# Patient Record
Sex: Female | Born: 1960 | Race: Black or African American | Hispanic: No | Marital: Married | Smoking: Never smoker
Health system: Southern US, Community
[De-identification: ages and names within clinical notes are randomized; demographics above are authoritative.]

## PROBLEM LIST (undated history)

## (undated) DIAGNOSIS — I10 Essential (primary) hypertension: Secondary | ICD-10-CM

## (undated) DIAGNOSIS — E039 Hypothyroidism, unspecified: Secondary | ICD-10-CM

---

## 2020-03-17 ENCOUNTER — Other Ambulatory Visit: Payer: Self-pay

## 2020-03-17 DIAGNOSIS — Z20822 Contact with and (suspected) exposure to covid-19: Secondary | ICD-10-CM

## 2020-03-18 LAB — NOVEL CORONAVIRUS, NAA: SARS-CoV-2, NAA: NOT DETECTED

## 2020-03-18 LAB — SARS-COV-2, NAA 2 DAY TAT

## 2021-05-06 ENCOUNTER — Other Ambulatory Visit: Payer: Self-pay

## 2021-05-06 ENCOUNTER — Ambulatory Visit (INDEPENDENT_AMBULATORY_CARE_PROVIDER_SITE_OTHER): Payer: BC Managed Care – PPO | Admitting: Orthopaedic Surgery

## 2021-05-06 ENCOUNTER — Ambulatory Visit (INDEPENDENT_AMBULATORY_CARE_PROVIDER_SITE_OTHER): Payer: BC Managed Care – PPO

## 2021-05-06 DIAGNOSIS — M25512 Pain in left shoulder: Secondary | ICD-10-CM | POA: Diagnosis not present

## 2021-05-06 DIAGNOSIS — G8929 Other chronic pain: Secondary | ICD-10-CM

## 2021-05-06 NOTE — Progress Notes (Signed)
? ?Office Visit Note ?  ?Patient: Amanda Velasquez           ?Date of Birth: [birth year]           ?MRN: [id number] ?Visit Date: 05/06/2021 ?             ?Requested by: No referring provider defined for this encounter. ?PCP: No primary care provider on file. ? ? ?Assessment & Plan: ?Visit Diagnoses:  ?1. Chronic left shoulder pain   ? ? ?Plan: Impression is chronic left upper arm pain and popping of unclear etiology.  This is a work-related injury.  She has been to her PCP about this and this has not gotten better so we will order an MRI of the left humerus to rule out structural normalities.  Follow-up after the MRI. ? ?Follow-Up Instructions: No follow-ups on file.  ? ?Orders:  ?Orders Placed This Encounter  ?Procedures  ? XR Shoulder Left  ? ?No orders of the defined types were placed in this encounter. ? ? ? ? Procedures: ?No procedures performed ? ? ?Clinical Data: ?No additional findings. ? ? ?Subjective: ?Chief Complaint  ?Patient presents with  ? Left Shoulder - Pain  ? ? ?HPI ? 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  She states that she had immediate pain in her arm has felt painful popping and pulling sensation towards the anterior aspect of the upper arm.  She has trouble reaching backwards to unhook her bra.  Denies any radicular symptoms.  Motrin gives temporary relief. ? ?Review of Systems  ?Constitutional: Negative.   ?HENT: Negative.    ?Eyes: Negative.   ?Respiratory: Negative.    ?Cardiovascular: Negative.   ?Endocrine: Negative.   ?Musculoskeletal: Negative.   ?Neurological: Negative.   ?Hematological: Negative.   ?Psychiatric/Behavioral: Negative.    ?All other systems reviewed and are negative. ? ? ?Objective: ?Vital Signs: There were no vitals taken for this visit. ? ?Physical Exam ?Vitals and nursing  note reviewed.  ?Constitutional:   ?   Appearance: She is well-developed.  ?HENT:  ?   Head: Normocephalic and atraumatic.  ?Pulmonary:  ?   Effort: Pulmonary effort is normal.  ?Abdominal:  ?   Palpations: Abdomen is soft.  ?Musculoskeletal:  ?   Cervical back: Neck supple.  ?Skin: ?   General: Skin is warm.  ?   Capillary Refill: Capillary refill takes less than 2 seconds.  ?Neurological:  ?   Mental Status: She is alert and oriented to person, place, and time.  ?Psychiatric:     ?   Behavior: Behavior normal.     ?   Thought Content: Thought content normal.     ?   Judgment: Judgment normal.  ? ? ?Ortho Exam ? ?Examination of the left shoulder and upper arm shows normal forward flexion external rotation abduction.  She has limited internal rotation to the side of the hip secondary to pain and guarding.  Manual muscle testing is normal.  She has no Popeye deformity or bruising or asymmetry.  Distal biceps tendon is intact. ? ?Specialty Comments:  ?No specialty comments available. ? ?Imaging: ?XR Shoulder Left ? ?Result Date: 05/06/2021 ?Type II acromion.  No acute or structural abnormalities.  ? ? ?PMFS History: ?There are no problems to display for  this patient. ? ?No past medical history on file.  ?No family history on file.  ? ?Social History  ? ?Occupational History  ? Not on file  ?Tobacco Use  ? Smoking status: Not on file  ? Smokeless tobacco: Not on file  ?Substance and Sexual Activity  ? Alcohol use: Not on file  ? Drug use: Not on file  ? Sexual activity: Not on file  ? ? ? ? ? ? ?

## 2021-05-16 ENCOUNTER — Other Ambulatory Visit: Payer: Self-pay

## 2021-05-16 ENCOUNTER — Ambulatory Visit
Admission: RE | Admit: 2021-05-16 | Discharge: 2021-05-16 | Disposition: A | Discharge status: Home / Self Care | Payer: Self-pay | Source: Ambulatory Visit | Attending: Orthopaedic Surgery | Admitting: Orthopaedic Surgery

## 2021-05-16 DIAGNOSIS — G8929 Other chronic pain: Secondary | ICD-10-CM

## 2021-05-27 ENCOUNTER — Other Ambulatory Visit: Payer: Self-pay

## 2021-05-27 ENCOUNTER — Ambulatory Visit (INDEPENDENT_AMBULATORY_CARE_PROVIDER_SITE_OTHER): Payer: BC Managed Care – PPO | Admitting: Orthopaedic Surgery

## 2021-05-27 DIAGNOSIS — G8929 Other chronic pain: Secondary | ICD-10-CM | POA: Diagnosis not present

## 2021-05-27 DIAGNOSIS — M25512 Pain in left shoulder: Secondary | ICD-10-CM

## 2021-05-27 NOTE — Progress Notes (Signed)
? ?  Office Visit Note ?  ?Patient: Amanda Velasquez           ?Date of Birth: 03/22/1960           ?MRN: [id number] ?Visit Date: 05/27/2021 ?             ?Requested by: No referring provider defined for this encounter. ?PCP: No primary care provider on file. ? ? ?Assessment & Plan: ?Visit Diagnoses:  ?1. Chronic left shoulder pain   ? ? ?Plan: Amanda Velasquez returns today to discuss left humerus MRI.  Denies any changes in symptoms. ? ?Examination of the left shoulder shows mild pain with manual muscle testing of the superior cuff.  There is no weakness.  Exam is unchanged. ? ?MRI of the left humerus is unremarkable.  There is suspected tendinopathy of the superior cuff with degenerative subcortical cyst in the greater tuberosity.  Due to the nature of the MRI at the resolution is suboptimal therefore we will need a dedicated shoulder MRI to rule out structural abnormalities.  Based on findings her symptoms are consistent with shoulder pathology.  Follow-up after the shoulder MRI. ? ?Follow-Up Instructions: No follow-ups on file.  ? ?Orders:  ?No orders of the defined types were placed in this encounter. ? ?No orders of the defined types were placed in this encounter. ? ? ? ? Procedures: ?No procedures performed ? ? ?Clinical Data: ?No additional findings. ? ? ?Subjective: ?Chief Complaint  ?Patient presents with  ? Left Upper Arm - Follow-up  ?  MRI review  ? ? ?HPI ? ?Review of Systems ? ? ?Objective: ?Vital Signs: There were no vitals taken for this visit. ? ?Physical Exam ? ?Ortho Exam ? ?Specialty Comments:  ?No specialty comments available. ? ?Imaging: ?No results found. ? ? ?PMFS History: ?Patient Active Problem List  ? Diagnosis Date Noted  ? Chronic left shoulder pain 05/27/2021  ? ?No past medical history on file.  ?No family history on file.  ? ?Social History  ? ?Occupational History  ? Not on file  ?Tobacco Use  ? Smoking status: Not on file  ? Smokeless tobacco: Not on file  ?Substance and Sexual Activity  ? Alcohol  use: Not on file  ? Drug use: Not on file  ? Sexual activity: Not on file  ? ? ? ? ? ? ?

## 2021-06-14 ENCOUNTER — Ambulatory Visit
Admission: RE | Admit: 2021-06-14 | Discharge: 2021-06-14 | Disposition: A | Discharge status: Home / Self Care | Payer: No Typology Code available for payment source | Source: Ambulatory Visit | Attending: Orthopaedic Surgery | Admitting: Orthopaedic Surgery

## 2021-06-14 DIAGNOSIS — G8929 Other chronic pain: Secondary | ICD-10-CM

## 2021-06-14 MED ORDER — IOPAMIDOL (ISOVUE-M 200) INJECTION 41%
15.0000 mL | Freq: Once | INTRAMUSCULAR | Status: AC
  Administered 2021-06-14: 15 mL via INTRA_ARTICULAR

## 2021-06-14 MED ORDER — IOPAMIDOL (ISOVUE-M 200) INJECTION 41%: 15.0000 mL | Freq: Once | INTRAMUSCULAR | Status: DC

## 2021-06-15 NOTE — Progress Notes (Signed)
Needs appt

## 2021-06-16 NOTE — Progress Notes (Signed)
APPT MADE

## 2021-06-23 ENCOUNTER — Other Ambulatory Visit: Payer: Self-pay

## 2021-06-23 ENCOUNTER — Ambulatory Visit (INDEPENDENT_AMBULATORY_CARE_PROVIDER_SITE_OTHER): Payer: BC Managed Care – PPO | Admitting: Orthopaedic Surgery

## 2021-06-23 ENCOUNTER — Encounter: Payer: Self-pay | Admitting: Orthopaedic Surgery

## 2021-06-23 ENCOUNTER — Telehealth: Payer: Self-pay | Admitting: Orthopaedic Surgery

## 2021-06-23 DIAGNOSIS — G8929 Other chronic pain: Secondary | ICD-10-CM

## 2021-06-23 DIAGNOSIS — M25512 Pain in left shoulder: Secondary | ICD-10-CM

## 2021-06-23 NOTE — Telephone Encounter (Signed)
PT order made

## 2021-06-23 NOTE — Progress Notes (Signed)
? ?  Office Visit Note ?  ?Patient: Amanda Velasquez           ?Date of Birth: 1960-10-29           ?MRN: [id number] ?Visit Date: 06/23/2021 ?             ?Requested by: No referring provider defined for this encounter. ?PCP: Pcp, No ? ? ?Assessment & Plan: ?Visit Diagnoses:  ?1. Chronic left shoulder pain   ? ? ?Plan: Velicia returns today to discuss left shoulder MRI.  Denies any changes in symptoms. ? ?Examination of the left shoulder is unchanged. ? ?MRI of the left shoulder basically shows tendinopathy of the superior cuff.  Type II acromion and moderate AC joint changes.  No full-thickness defects.  These findings were reviewed with Gunnar Fusi in detail and treatment options were reviewed including physical therapy, cortisone injection, NSAIDs.  She will just stick to NSAIDs for now and she will follow-up if her symptoms get worse.  Discussed that the findings do not appear to be traumatic but rather degenerative. ? ?Follow-Up Instructions: No follow-ups on file.  ? ?Orders:  ?No orders of the defined types were placed in this encounter. ? ?No orders of the defined types were placed in this encounter. ? ? ? ? Procedures: ?No procedures performed ? ? ?Clinical Data: ?No additional findings. ? ? ?Subjective: ?Chief Complaint  ?Patient presents with  ? Left Shoulder - Pain  ? ? ?HPI ? ?Review of Systems  ?Constitutional: Negative.   ?HENT: Negative.    ?Eyes: Negative.   ?Respiratory: Negative.    ?Cardiovascular: Negative.   ?Endocrine: Negative.   ?Musculoskeletal: Negative.   ?Neurological: Negative.   ?Hematological: Negative.   ?Psychiatric/Behavioral: Negative.    ?All other systems reviewed and are negative. ? ? ?Objective: ?Vital Signs: There were no vitals taken for this visit. ? ?Physical Exam ?Vitals and nursing note reviewed.  ?Constitutional:   ?   Appearance: She is well-developed.  ?Pulmonary:  ?   Effort: Pulmonary effort is normal.  ?Skin: ?   General: Skin is warm.  ?   Capillary Refill: Capillary refill  takes less than 2 seconds.  ?Neurological:  ?   Mental Status: She is alert and oriented to person, place, and time.  ?Psychiatric:     ?   Behavior: Behavior normal.     ?   Thought Content: Thought content normal.     ?   Judgment: Judgment normal.  ? ? ?Ortho Exam ? ?Specialty Comments:  ?No specialty comments available. ? ?Imaging: ?No results found. ? ? ?PMFS History: ?Patient Active Problem List  ? Diagnosis Date Noted  ? Chronic left shoulder pain 05/27/2021  ? ?History reviewed. No pertinent past medical history.  ?History reviewed. No pertinent family history.  ?History reviewed. No pertinent surgical history. ?Social History  ? ?Occupational History  ? Not on file  ?Tobacco Use  ? Smoking status: Not on file  ? Smokeless tobacco: Not on file  ?Substance and Sexual Activity  ? Alcohol use: Not on file  ? Drug use: Not on file  ? Sexual activity: Not on file  ? ? ? ? ? ? ?

## 2021-06-23 NOTE — Telephone Encounter (Signed)
Patient called. She would like to have physical therapy. Would like a referral for PT. Her call back number is [phone] ?

## 2022-10-11 ENCOUNTER — Ambulatory Visit
Admission: EM | Admit: 2022-10-11 | Discharge: 2022-10-11 | Disposition: A | Discharge status: Home / Self Care | Payer: BC Managed Care – PPO

## 2022-10-11 DIAGNOSIS — Z5189 Encounter for other specified aftercare: Secondary | ICD-10-CM

## 2022-10-11 HISTORY — DX: Hypothyroidism, unspecified: E03.9

## 2022-10-11 HISTORY — DX: Essential (primary) hypertension: I10

## 2022-10-11 NOTE — ED Provider Notes (Signed)
[facility]    CSN: [id number] Arrival date & time: 10/11/22  1655      History   Chief Complaint Chief Complaint  Patient presents with   Wound Check    HPI Amanda Velasquez is a 62 y.o. female.   62 year old female, Amanda Velasquez, presents to urgent care for evaluation of wound from recent dog bite. Pt states she was bitten by Pit bull approximately 2 weeks prior at work social event in [place], pt states she was evaluated and placed on augmentin and continued to have swelling and drainage and was switched to clindamycin. Pt states it is still sore, but healing. Pt's tetanus is up to date.   PMH of HTN, Pt is right hand dominant.  The history is provided by the patient. No language interpreter was used.    Past Medical History:  Diagnosis Date   Hypertension    Hypothyroid     Patient Active Problem List   Diagnosis Date Noted   Visit for wound check 10/11/2022   Chronic left shoulder pain 05/27/2021    Past Surgical History:  Procedure Laterality Date   CESAREAN SECTION      OB History   No obstetric history on file.      Home Medications    Prior to Admission medications   Medication Sig Start Date End Date Taking? Authorizing Provider  amLODipine (NORVASC) 10 MG tablet Take 10 mg by mouth daily.    [provider]  hydrochlorothiazide (HYDRODIURIL) 25 MG tablet Take 25 mg by mouth daily.    [provider]  rosuvastatin (CRESTOR) 10 MG tablet Take 10 mg by mouth at bedtime.    [provider]    Family History History reviewed. No pertinent family history.  Social History Social History   Tobacco Use   Smoking status: Never   Smokeless tobacco: Never     Allergies   Minocin [minocycline]   Review of Systems Review of Systems  Musculoskeletal:  Positive for myalgias.  Skin:  Positive for wound.  All other systems reviewed and are negative.    Physical Exam Triage Vital Signs ED Triage Vitals  [10/11/22 1727]  Encounter Vitals Group     BP (!) 156/86     Systolic BP Percentile      Diastolic BP Percentile      Pulse Rate 85     Resp 18     Temp 98.2 F (36.8 C)     Temp Source Oral     SpO2 100 %     Weight      Height      Head Circumference      Peak Flow      Pain Score 2     Pain Loc      Pain Education      Exclude from Growth Chart    No data found.  Updated Vital Signs BP (!) 156/86 (BP Location: Left Arm)   Pulse 85   Temp 98.2 F (36.8 C) (Oral)   Resp 18   SpO2 100%   Visual Acuity Right Eye Distance:   Left Eye Distance:   Bilateral Distance:    Right Eye Near:   Left Eye Near:    Bilateral Near:     Physical Exam Vitals and nursing note reviewed.  Skin:    Findings: Signs of injury and wound present.     Comments: Right upper arm, healing dog bite, see photo  Neurological:  General: No focal deficit present.     Mental Status: She is alert and oriented to person, place, and time.     GCS: GCS eye subscore is 4. GCS verbal subscore is 5. GCS motor subscore is 6.     Cranial Nerves: No cranial nerve deficit.     Sensory: No sensory deficit.  Psychiatric:        Attention and Perception: Attention normal.        Mood and Affect: Mood normal.        Speech: Speech normal.        Behavior: Behavior normal.      UC Treatments / Results  Labs (all labs ordered are listed, but only abnormal results are displayed) Labs Reviewed - No data to display  EKG   Radiology No results found.  Procedures Procedures (including critical care time)  Medications Ordered in UC Medications - No data to display  Initial Impression / Assessment and Plan / UC Course  I have reviewed the triage vital signs and the nursing notes.  Pertinent labs & imaging results that were available during my care of the patient were reviewed by me and considered in my medical decision making (see chart for details).     Ddx: Dog bite, wound check Final  Clinical Impressions(s) / UC Diagnoses   Final diagnoses:  Visit for wound check     Discharge Instructions      Wound appears to be healing well,continue wound care as previously recommended. Follow up with PCP as needed.      ED Prescriptions   None    PDMP not reviewed this encounter.   Clancy Gourd, NP 10/11/22 [phone]

## 2022-10-11 NOTE — Discharge Instructions (Addendum)
Wound appears to be healing well,continue wound care as previously recommended. Follow up with PCP as needed.

## 2022-10-11 NOTE — ED Triage Notes (Signed)
Wound check from dog bite

## 2022-12-12 ENCOUNTER — Other Ambulatory Visit: Payer: Self-pay

## 2022-12-12 ENCOUNTER — Emergency Department: Payer: BC Managed Care – PPO

## 2022-12-12 DIAGNOSIS — R09A2 Foreign body sensation, throat: Secondary | ICD-10-CM | POA: Diagnosis not present

## 2022-12-12 DIAGNOSIS — Z5321 Procedure and treatment not carried out due to patient leaving prior to being seen by health care provider: Secondary | ICD-10-CM | POA: Insufficient documentation

## 2022-12-12 DIAGNOSIS — R070 Pain in throat: Secondary | ICD-10-CM | POA: Insufficient documentation

## 2022-12-12 NOTE — ED Triage Notes (Signed)
Pt presents to ER with c/o poss FB that feels like it is lodged in her throat. Pt states she started to feel like there was something stuck in her throat around 2030.  Pt reports she took a drink of a [place], and states she began to feel a sharp pain in her throat, and felt like something was stuck.  Denies eating any difficult to chew food tonight.  Pt states pain in throat has not become any better.  No diff breathing at this time.  Pt is otherwise A&O x4 and in NAD.

## 2022-12-13 ENCOUNTER — Emergency Department
Admission: EM | Admit: 2022-12-13 | Discharge: 2022-12-13 | Discharge status: Against Medical Advice | Payer: BC Managed Care – PPO | Attending: Emergency Medicine | Admitting: Emergency Medicine

## 2023-02-15 IMAGING — MR MR SHOULDER*L* W/ CM
4 of 6 series · 13 of 40 positions shown · IV contrast (agent unspecified)
Comparison: Humerus MRI 05/16/2021

CLINICAL DATA: Left shoulder injury in Tuesday December, 2020. Persistent
pain and weakness.

EXAM:
MR ARTHROGRAM OF THE left SHOULDER
TECHNIQUE: Multiplanar, multisequence MR imaging of the left shoulder was
performed following the administration of intra-articular contrast.
CONTRAST:  See Injection Documentation.

[Series 7: T1 fat-sat · axial · left · 3.0mm · 0.36mm/px · z∈[-67,+0]mm · 3 of 28 slices shown (1 of 2)]
[im 6/28]
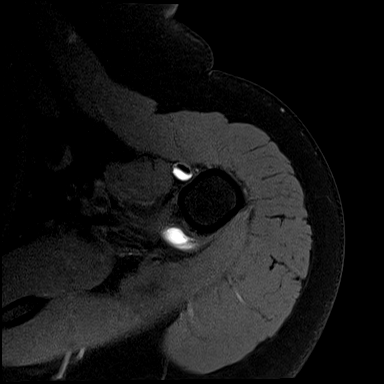
[im 17/28]
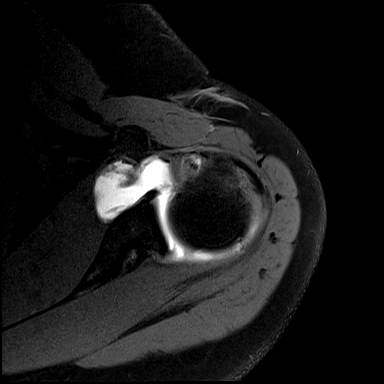
[im 28/28]
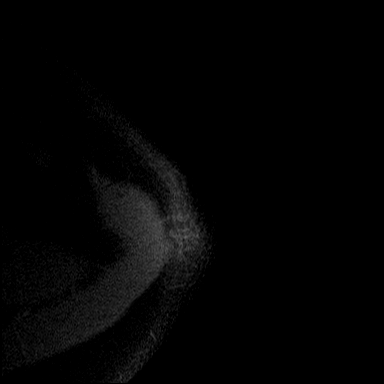

[Series 8: T2 fat-sat · oblique · left · 3.0mm · 0.22mm/px · 4 of 27 slices shown (1 of 2)]
[im 1/27]
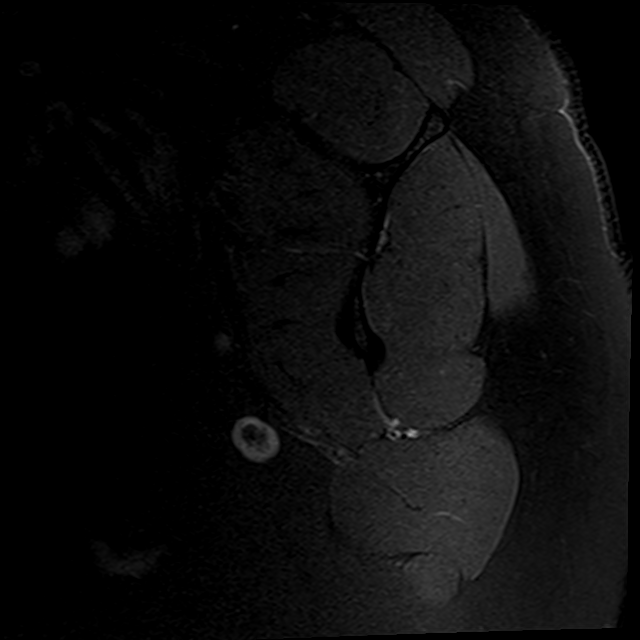
[im 5/27]
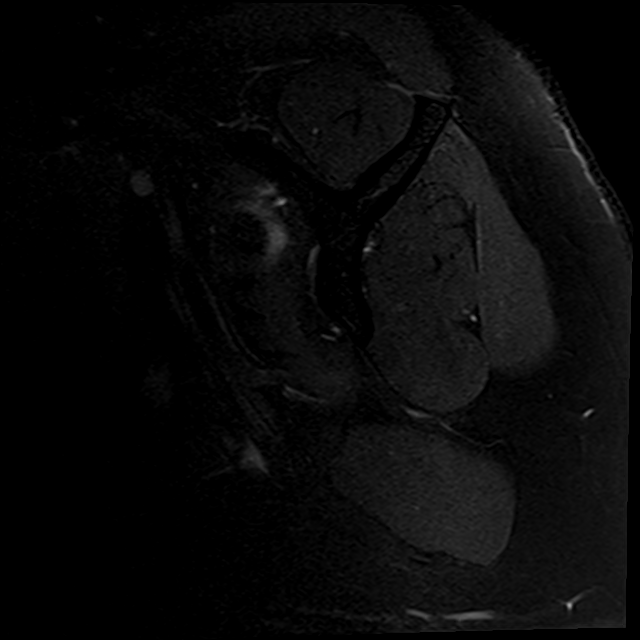
[im 14/27]
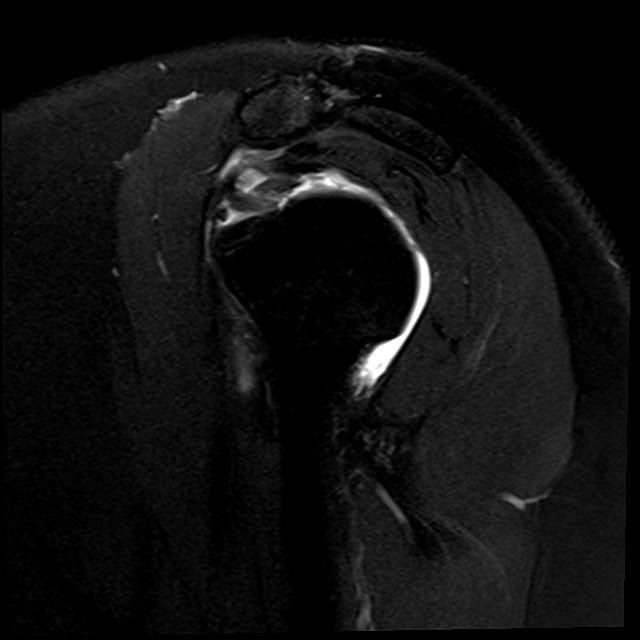
[im 22/27]
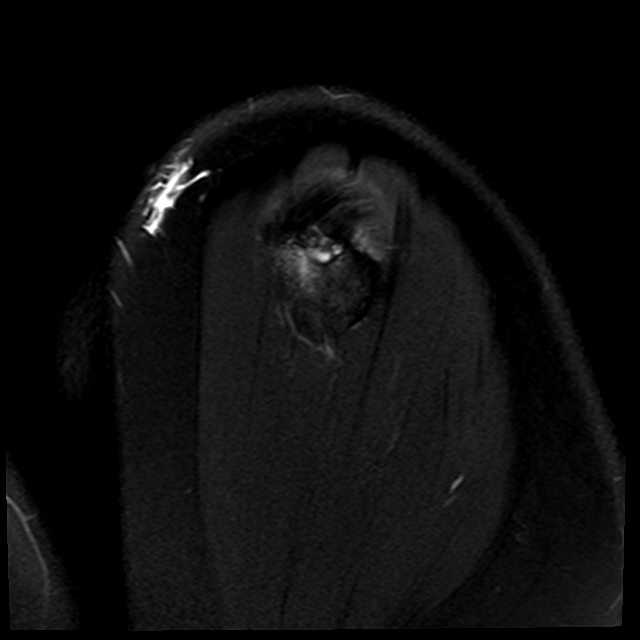

[Series 9: T1 fat-sat · oblique · left · 3.0mm · 0.18mm/px · 3 of 25 slices shown (2 of 2)]
[im 5/25]
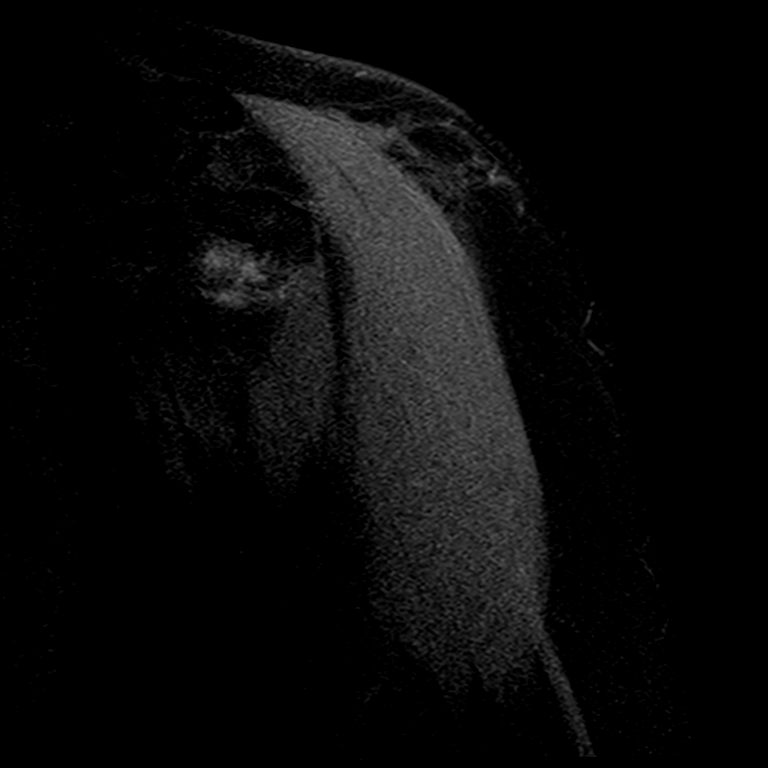
[im 13/25]
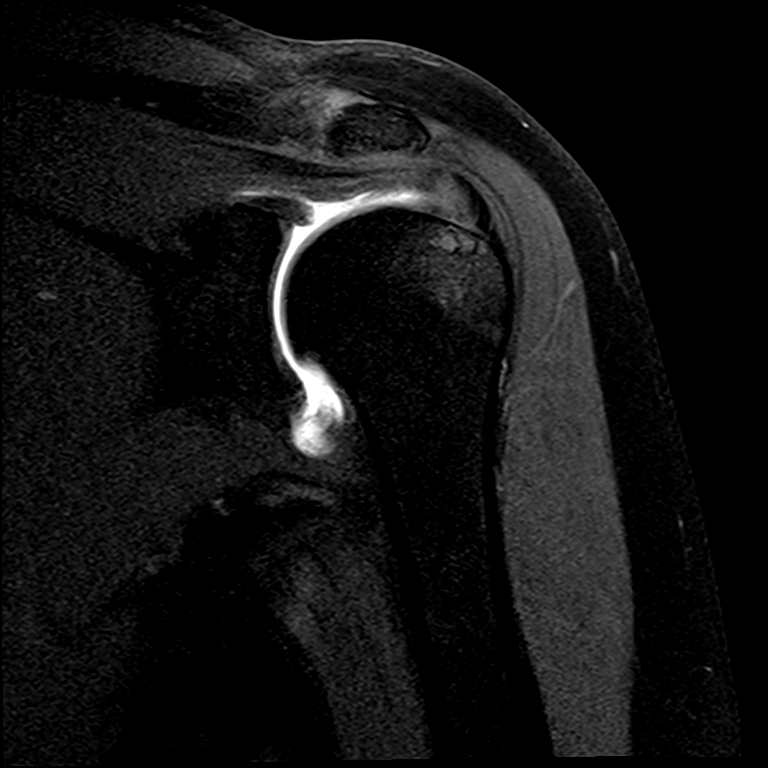
[im 21/25]
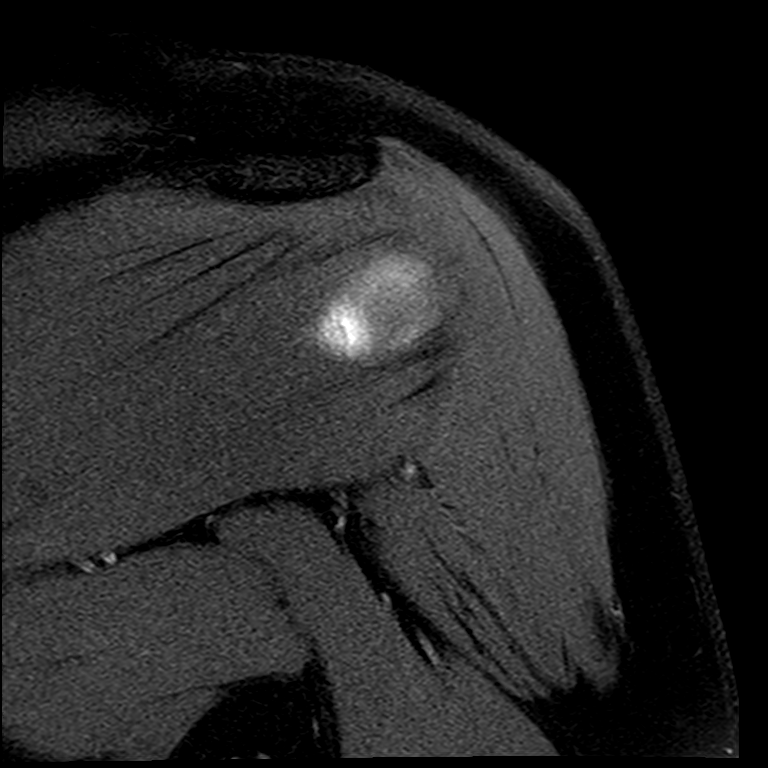

[Series 11: T2 fat-sat · oblique · left · 3.0mm · 0.22mm/px · 3 of 25 slices shown (2 of 2)]
[im 5/25]
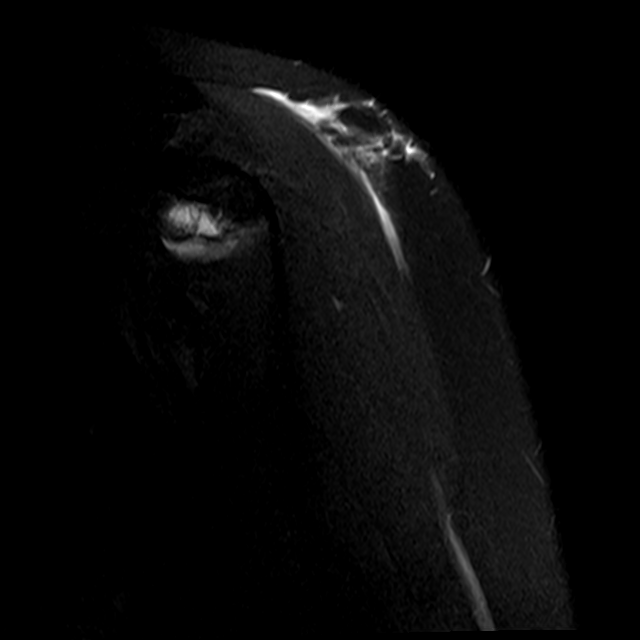
[im 13/25]
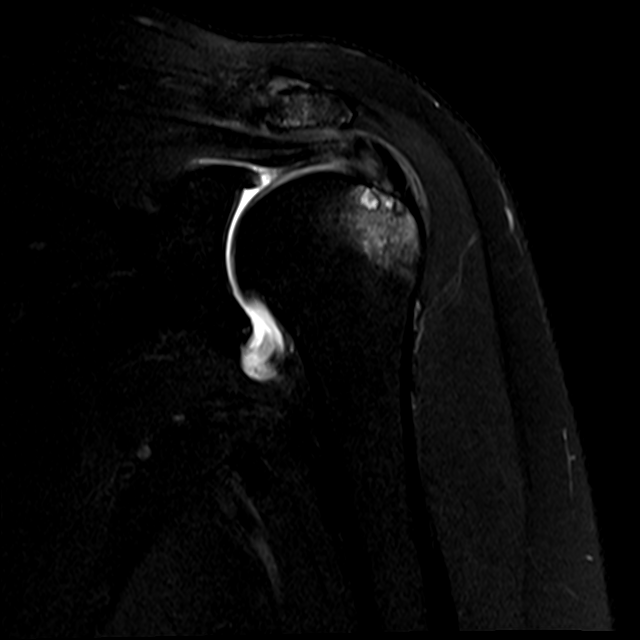
[im 21/25]
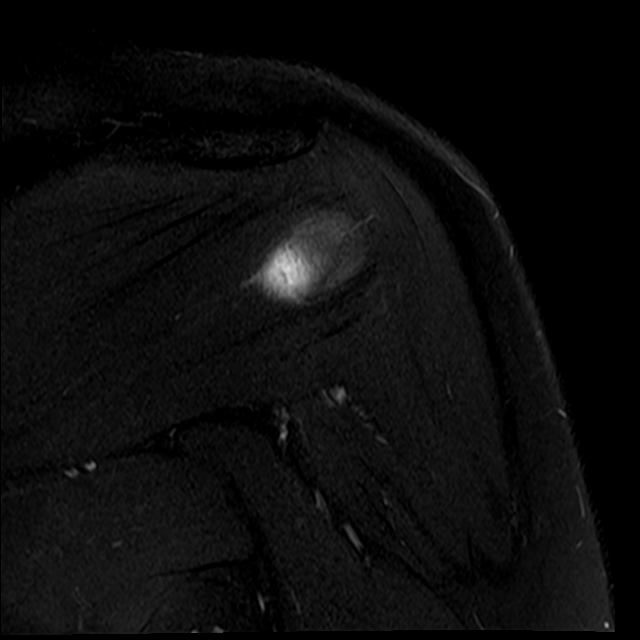

[13 of 40 positions shown; findings below may reference images not displayed]

FINDINGS: Rotator cuff: Significant rotator cuff tendinopathy/tendinosis.
Significant thickening/inflammation and tendinopathy the involving
the supraspinatus and infraspinatus tendons the articular. There are
also interstitial tears. No discrete full-thickness retracted tear.

Muscles: No significant findings.

Biceps long head: Intact

Acromioclavicular Joint: Moderate degenerative changes. The acromion
is type 2-3 in shape. No lateral downsloping or subacromial
spurring.

Glenohumeral Joint: Normal articular cartilage. No loose bodies or
synovitis.

Labrum: The glenoid labrum, glenohumeral ligaments and anterior
labrocapsular complex are intact.

Bones: Subchondral cystic change and surrounding marrow edema
involving the greater tuberosity.
IMPRESSION: 1. Significant rotator cuff tendinopathy/tendinosis with
interstitial tears but no discrete full-thickness retracted tear.
2. Intact long head biceps tendon, glenoid labrum, glenohumeral
ligaments and anterior labrocapsular complex.
3. Moderate AC joint degenerative changes and type 2-3 acromion.
